# Patient Record
Sex: Male | Born: 1995 | Hispanic: No | Marital: Single | Smoking: Former smoker
Health system: Southern US, Community
[De-identification: ages and names within clinical notes are randomized; demographics above are authoritative.]

## PROBLEM LIST (undated history)

## (undated) DIAGNOSIS — R748 Abnormal levels of other serum enzymes: Secondary | ICD-10-CM

## (undated) HISTORY — PX: DENTAL SURGERY: SHX609

## (undated) HISTORY — PX: HERNIA REPAIR: SHX51

## (undated) HISTORY — DX: Abnormal levels of other serum enzymes: R74.8

---
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
## 2018-06-26 ENCOUNTER — Other Ambulatory Visit: Payer: Self-pay

## 2018-06-26 ENCOUNTER — Emergency Department (HOSPITAL_COMMUNITY)
Admission: EM | Admit: 2018-06-26 | Discharge: 2018-06-26 | Disposition: A | Discharge status: Home / Self Care | Payer: Managed Care, Other (non HMO) | Attending: Emergency Medicine | Admitting: Emergency Medicine

## 2018-06-26 ENCOUNTER — Encounter (HOSPITAL_COMMUNITY): Payer: Self-pay | Admitting: Emergency Medicine

## 2018-06-26 DIAGNOSIS — R69 Illness, unspecified: Secondary | ICD-10-CM

## 2018-06-26 DIAGNOSIS — F17210 Nicotine dependence, cigarettes, uncomplicated: Secondary | ICD-10-CM | POA: Diagnosis not present

## 2018-06-26 DIAGNOSIS — Z79899 Other long term (current) drug therapy: Secondary | ICD-10-CM | POA: Diagnosis not present

## 2018-06-26 DIAGNOSIS — J111 Influenza due to unidentified influenza virus with other respiratory manifestations: Secondary | ICD-10-CM

## 2018-06-26 DIAGNOSIS — R0981 Nasal congestion: Secondary | ICD-10-CM | POA: Diagnosis present

## 2018-06-26 MED ORDER — BENZONATATE 100 MG PO CAPS: 100.0000 mg | ORAL_CAPSULE | Freq: Three times a day (TID) | ORAL | 0 refills | Status: AC

## 2018-06-26 MED ORDER — FLUTICASONE PROPIONATE 50 MCG/ACT NA SUSP: 1.0000 | Freq: Every day | NASAL | 2 refills | Status: AC

## 2018-06-26 MED ORDER — KETOROLAC TROMETHAMINE 60 MG/2ML IM SOLN
30.0000 mg | Freq: Once | INTRAMUSCULAR | Status: AC
  Administered 2018-06-26: 30 mg via INTRAMUSCULAR
  Filled 2018-06-26: qty 2

## 2018-06-26 NOTE — ED Provider Notes (Signed)
[facility] EMERGENCY DEPARTMENT Provider Note   CSN: [id number] Arrival date & time: 06/26/18  2200     History   Chief Complaint Chief Complaint  Patient presents with  . Flu like symptoms    HPI Isaac Wiley is a 23 y.o. male who presents for evaluation of 4 days of nasal congestion, cough, generalized body aches, diarrhea, fever, chills, fatigue, decreased appetite.  He states that he measured a fever earlier tonight at 101.0.  He states he took Tylenol for fever relief.  He has not taken any other medications.  He states that he is still been able to eat and drink but does report some decreased appetite.  He reports he has not had any vomiting or abdominal pain.  He does report that he has had some episodes of loose stools.  He denies any blood in stool.  Denies any recent antibiotic use, travel outside the country.  Patient states that he has had a cough that is productive of phlegm.  No hemoptysis.  Patient states that he has no history of asthma.  He did not get a flu shot this year.  He reports that his girlfriend is sick with similar symptoms.  The history is provided by the patient.    Past Medical History:  Diagnosis Date  . Elevated liver enzymes     Patient Active Problem List   Diagnosis Date Noted  . Obesity 01/13/2012  . Elevated transaminase level     Past Surgical History:  Procedure Laterality Date  . DENTAL SURGERY    . HERNIA REPAIR          Home Medications    Prior to Admission medications   Medication Sig Start Date End Date Taking? Authorizing Provider  amphetamine-dextroamphetamine (ADDERALL XR) 20 MG 24 hr capsule  08/26/12   [provider]  benzonatate (TESSALON) 100 MG capsule Take 1 capsule (100 mg total) by mouth every 8 (eight) hours. 06/26/18   Maxwell Caul, PA-C  fluticasone (FLONASE) 50 MCG/ACT nasal spray Place 1 spray into both nostrils daily. 06/26/18   Maxwell Caul, PA-C  SEROQUEL XR 50 MG TB24  08/11/12    [provider]    Family History No family history on file.  Social History Social History   Tobacco Use  . Smoking status: Current Every Day Smoker    Packs/day: 0.50    Types: Cigarettes  . Smokeless tobacco: Never Used  Substance Use Topics  . Alcohol use: Never    Frequency: Never  . Drug use: Never     Allergies   Patient has no known allergies.   Review of Systems Review of Systems  Constitutional: Positive for appetite change, chills, fatigue and fever.  HENT: Positive for congestion. Negative for sore throat.   Respiratory: Positive for cough. Negative for shortness of breath.   Cardiovascular: Negative for chest pain.  Gastrointestinal: Positive for diarrhea. Negative for abdominal pain, nausea and vomiting.  All other systems reviewed and are negative.    Physical Exam Updated Vital Signs BP 126/68 (BP Location: Right Arm)   Pulse 80   Temp 98.9 F (37.2 C) (Oral)   Resp 15   Ht 5\' 6"  (1.676 m)   Wt 120.2 kg   SpO2 98%   BMI 42.77 kg/m   Physical Exam Vitals signs and nursing note reviewed.  Constitutional:      Appearance: Normal appearance. He is well-developed.  HENT:     Head: Normocephalic and atraumatic.  Nose: Congestion present.     Comments: Edematous and erythematous nasal turbinates bilaterally.    Mouth/Throat:     Pharynx: Posterior oropharyngeal erythema present.     Comments: Slight posterior oropharynx erythema. No edema. No exudates. Airway is patent, phonation is intact. Uvula is midline.  No trismus. Eyes:     General: Lids are normal.     Conjunctiva/sclera: Conjunctivae normal.     Pupils: Pupils are equal, round, and reactive to light.  Neck:     Musculoskeletal: Full passive range of motion without pain.  Cardiovascular:     Rate and Rhythm: Normal rate and regular rhythm.     Pulses: Normal pulses.     Heart sounds: Normal heart sounds. No murmur. No friction rub. No gallop.   Pulmonary:     Effort:  Pulmonary effort is normal.     Breath sounds: Normal breath sounds.     Comments: Lungs clear to auscultation bilaterally.  Symmetric chest rise.  No wheezing, rales, rhonchi. Abdominal:     Palpations: Abdomen is soft. Abdomen is not rigid.     Tenderness: There is no abdominal tenderness. There is no guarding.     Comments: Abdomen is soft, non-distended, non-tender. No rigidity, No guarding. No peritoneal signs.  Musculoskeletal: Normal range of motion.  Skin:    General: Skin is warm and dry.     Capillary Refill: Capillary refill takes less than 2 seconds.  Neurological:     Mental Status: He is alert and oriented to person, place, and time.  Psychiatric:        Speech: Speech normal.      ED Treatments / Results  Labs (all labs ordered are listed, but only abnormal results are displayed) Labs Reviewed - No data to display  EKG None  Radiology No results found.  Procedures Procedures (including critical care time)  Medications Ordered in ED Medications  ketorolac (TORADOL) injection 30 mg (30 mg Intramuscular Given 06/26/18 2320)     Initial Impression / Assessment and Plan / ED Course  I have reviewed the triage vital signs and the nursing notes.  Pertinent labs & imaging results that were available during my care of the patient were reviewed by me and considered in my medical decision making (see chart for details).     23 year old male who presents for evaluation of 4 days of generalized body aches, fever/chills, fatigue, diarrhea, congestion.  Reports girlfriend at home with similar symptoms.  He did not get a flu shot this year.  On initial ED arrival, patient is febrile, slightly tachycardic.  Vital signs otherwise stable.  On exam, no abdominal tenderness.  Lungs clear to auscultation bilaterally.  Consider influenza versus viral illness.  History/physical exam not concerning for appendicitis, diverticulitis.  History/physical exam not concerning for  pneumonia.  I discussed with patient that given that he is greater than 48 hours since on his symptoms, he is not a candidate for Tamiflu.  Plan for p.o. fluids, analgesics here in ED.  Repeat vitals stable.  Temperature has improved since being here in the ED.  Patient stable for discharge at this time. At this time, patient exhibits no emergent life-threatening condition that require further evaluation in ED or admission Patient had ample opportunity for questions and discussion. All patient's questions were answered with full understanding. Strict return precautions discussed. Patient expresses understanding and agreement to plan.   Portions of this note were generated with Scientist, clinical (histocompatibility and immunogenetics). Dictation errors may occur despite best  attempts at proofreading.  Final Clinical Impressions(s) / ED Diagnoses   Final diagnoses:  Influenza-like illness    ED Discharge Orders         Ordered    fluticasone (FLONASE) 50 MCG/ACT nasal spray  Daily     06/26/18 2326    benzonatate (TESSALON) 100 MG capsule  Every 8 hours     06/26/18 2326           Maxwell CaulLayden, Khali Albanese A, PA-C 06/26/18 2335    Gilda CreasePollina, Christopher J, MD 06/26/18 [phone]

## 2018-06-26 NOTE — ED Triage Notes (Signed)
Onset 4 days fever, diarrhea, chest and nasal congestion, body aches, cough with clear mucus.

## 2018-06-26 NOTE — Discharge Instructions (Signed)
You can take Tylenol or Ibuprofen as directed for pain. You can alternate Tylenol and Ibuprofen every 4 hours. If you take Tylenol at 1pm, then you can take Ibuprofen at 5pm. Then you can take Tylenol again at 9pm.   Make sure you are drinking plenty of fluids and staying hydrated.   Use Flonase for congestion and Tessalon Perles for cough.  Return to the emergency department for any fever despite medications, vomiting, difficulty breathing, abdominal pain or any other worsening or concerning symptoms.

## 2020-01-09 ENCOUNTER — Other Ambulatory Visit: Payer: Self-pay

## 2020-01-09 ENCOUNTER — Encounter (HOSPITAL_COMMUNITY): Payer: Self-pay | Admitting: *Deleted

## 2020-01-09 ENCOUNTER — Emergency Department (HOSPITAL_COMMUNITY)
Admission: EM | Admit: 2020-01-09 | Discharge: 2020-01-09 | Disposition: A | Discharge status: Home / Self Care | Payer: Managed Care, Other (non HMO) | Attending: Emergency Medicine | Admitting: Emergency Medicine

## 2020-01-09 ENCOUNTER — Emergency Department (HOSPITAL_COMMUNITY): Payer: Managed Care, Other (non HMO)

## 2020-01-09 DIAGNOSIS — R05 Cough: Secondary | ICD-10-CM | POA: Diagnosis present

## 2020-01-09 DIAGNOSIS — Z87891 Personal history of nicotine dependence: Secondary | ICD-10-CM | POA: Insufficient documentation

## 2020-01-09 DIAGNOSIS — Z79899 Other long term (current) drug therapy: Secondary | ICD-10-CM | POA: Diagnosis not present

## 2020-01-09 DIAGNOSIS — K2100 Gastro-esophageal reflux disease with esophagitis, without bleeding: Secondary | ICD-10-CM | POA: Insufficient documentation

## 2020-01-09 DIAGNOSIS — Z20822 Contact with and (suspected) exposure to covid-19: Secondary | ICD-10-CM | POA: Insufficient documentation

## 2020-01-09 LAB — SARS CORONAVIRUS 2 BY RT PCR (HOSPITAL ORDER, PERFORMED IN ~~LOC~~ HOSPITAL LAB): SARS Coronavirus 2: NEGATIVE

## 2020-01-09 MED ORDER — PANTOPRAZOLE SODIUM 40 MG PO TBEC
40.0000 mg | DELAYED_RELEASE_TABLET | Freq: Once | ORAL | Status: AC
  Administered 2020-01-09: 40 mg via ORAL
  Filled 2020-01-09: qty 1

## 2020-01-09 MED ORDER — PANTOPRAZOLE SODIUM 20 MG PO TBEC: 20.0000 mg | DELAYED_RELEASE_TABLET | Freq: Every day | ORAL | 0 refills | Status: AC

## 2020-01-09 MED ORDER — ALBUTEROL SULFATE HFA 108 (90 BASE) MCG/ACT IN AERS
2.0000 | INHALATION_SPRAY | Freq: Once | RESPIRATORY_TRACT | Status: AC
  Administered 2020-01-09: 2 via RESPIRATORY_TRACT
  Filled 2020-01-09: qty 6.7

## 2020-01-09 NOTE — ED Triage Notes (Signed)
Pt reports for the last 3-4 weeks when he lays down to go to sleep he starts coughing and coughs until he can't breathe. He states if he uses his girlfriend's inhaler he gets better for a while but the symptoms seem to be worsening.pt denies any shortness of breath at any other time.  Also reports after he eats he sometimes feels like the food gets stuck in his throat. NAD.

## 2020-01-09 NOTE — Discharge Instructions (Signed)
Follow-up with your family doctor for Dr. Karilyn Cota in 1 to 2 weeks

## 2020-01-09 NOTE — ED Provider Notes (Signed)
[facility] EMERGENCY DEPARTMENT Provider Note   CSN: [id number] Arrival date & time: 01/09/20  0125     History Chief Complaint  Patient presents with  . Cough    Isaac Wiley is a 24 y.o. male.  Patient complains of coughing after laying down in bed with some shortness of breath  The history is provided by the patient and medical records. No language interpreter was used.  Cough Cough characteristics:  Dry and non-productive Sputum characteristics:  Nondescript Severity:  Mild Onset quality:  Gradual Timing:  Constant Progression:  Waxing and waning Chronicity:  New Smoker: no   Relieved by:  Nothing Worsened by:  Nothing Ineffective treatments:  None tried Associated symptoms: no chest pain, no eye discharge, no headaches and no rash        Past Medical History:  Diagnosis Date  . Elevated liver enzymes     Patient Active Problem List   Diagnosis Date Noted  . Obesity 01/13/2012  . Elevated transaminase level     Past Surgical History:  Procedure Laterality Date  . DENTAL SURGERY    . HERNIA REPAIR         No family history on file.  Social History   Tobacco Use  . Smoking status: Former Smoker    Packs/day: 0.50    Types: Cigarettes  . Smokeless tobacco: Never Used  Vaping Use  . Vaping Use: Never used  Substance Use Topics  . Alcohol use: Never  . Drug use: Never    Home Medications Prior to Admission medications   Medication Sig Start Date End Date Taking? Authorizing Provider  amphetamine-dextroamphetamine (ADDERALL XR) 20 MG 24 hr capsule  08/26/12   [provider]  benzonatate (TESSALON) 100 MG capsule Take 1 capsule (100 mg total) by mouth every 8 (eight) hours. Patient not taking: Reported on 01/09/2020 06/26/18   Graciella Freer A, PA-C  fluticasone (FLONASE) 50 MCG/ACT nasal spray Place 1 spray into both nostrils daily. Patient not taking: Reported on 01/09/2020 06/26/18   Graciella Freer A, PA-C  pantoprazole  (PROTONIX) 20 MG tablet Take 1 tablet (20 mg total) by mouth daily. 01/09/20   Bethann Berkshire, MD    Allergies    Patient has no known allergies.  Review of Systems   Review of Systems  Constitutional: Negative for appetite change and fatigue.  HENT: Negative for congestion, ear discharge and sinus pressure.   Eyes: Negative for discharge.  Respiratory: Positive for cough.   Cardiovascular: Negative for chest pain.  Gastrointestinal: Negative for abdominal pain and diarrhea.  Genitourinary: Negative for frequency and hematuria.  Musculoskeletal: Negative for back pain.  Skin: Negative for rash.  Neurological: Negative for seizures and headaches.  Psychiatric/Behavioral: Negative for hallucinations.    Physical Exam Updated Vital Signs BP 114/68   Pulse 75   Temp 98.4 F (36.9 C) (Oral)   Resp 16   Ht 5\' 7"  (1.702 m)   Wt 109.7 kg   SpO2 99%   BMI 37.87 kg/m   Physical Exam Vitals and nursing note reviewed.  Constitutional:      Appearance: He is well-developed.  HENT:     Head: Normocephalic.     Nose: Nose normal.     Mouth/Throat:     Mouth: Mucous membranes are moist.  Eyes:     General: No scleral icterus.    Conjunctiva/sclera: Conjunctivae normal.  Neck:     Thyroid: No thyromegaly.  Cardiovascular:     Rate and Rhythm:  Normal rate and regular rhythm.     Heart sounds: No murmur heard.  No friction rub. No gallop.   Pulmonary:     Breath sounds: No stridor. No wheezing or rales.  Chest:     Chest wall: No tenderness.  Abdominal:     General: There is no distension.     Tenderness: There is no abdominal tenderness. There is no rebound.  Musculoskeletal:        General: Normal range of motion.     Cervical back: Neck supple.  Lymphadenopathy:     Cervical: No cervical adenopathy.  Skin:    Findings: No erythema or rash.  Neurological:     Mental Status: He is alert and oriented to person, place, and time.     Motor: No abnormal muscle tone.      Coordination: Coordination normal.  Psychiatric:        Behavior: Behavior normal.     ED Results / Procedures / Treatments   Labs (all labs ordered are listed, but only abnormal results are displayed) Labs Reviewed  SARS CORONAVIRUS 2 BY RT PCR (HOSPITAL ORDER, PERFORMED IN [facility])    EKG None  Radiology DG Chest Port 1 View  Result Date: 01/09/2020 CLINICAL DATA:  Cough x3 weeks EXAM: PORTABLE CHEST 1 VIEW COMPARISON:  None. FINDINGS: Mild right basilar opacity, atelectasis versus mild pneumonia. Left lung is clear. No pleural effusion or pneumothorax. The heart is normal in size. IMPRESSION: Mild right basilar opacity, atelectasis versus mild pneumonia. Electronically Signed   By: Charline Bills M.D.   On: 01/09/2020 06:52    Procedures Procedures (including critical care time)  Medications Ordered in ED Medications  albuterol (VENTOLIN HFA) 108 (90 Base) MCG/ACT inhaler 2 puff (2 puffs Inhalation Given 01/09/20 0736)  pantoprazole (PROTONIX) EC tablet 40 mg (40 mg Oral Given 01/09/20 0736)    ED Course  I have reviewed the triage vital signs and the nursing notes.  Pertinent labs & imaging results that were available during my care of the patient were reviewed by me and considered in my medical decision making (see chart for details).    MDM Rules/Calculators/A&P                         Patient with cough when he lays down.  Negative Covid.  Chest x-ray shows most likely atelectasis.  I suspect he is having GERD symptoms and will place him on protonic and have him follow-up with GI     This patient presents to the ED for concern of cough, this involves an extensive number of treatment options, and is a complaint that carries with it a high risk of complications and morbidity.  The differential diagnosis includes Covid bronchitis GERD   Lab Tests:     Medicines ordered:   I ordered medication protonic  Imaging Studies ordered:   I ordered  imaging studies which included chest x-ray  I independently visualized and interpreted imaging which showed atelectasis  Additional history obtained:   Additional history obtained from record  Previous records obtained and reviewed..  Consultations Obtained:    scussed lab an Reevaluation:  After the interventions stated above, I reevaluated the patient and found unchanged  Critical Interventions:  .   Final Clinical Impression(s) / ED Diagnoses Final diagnoses:  Gastroesophageal reflux disease with esophagitis without hemorrhage    Rx / DC Orders ED Discharge Orders  Ordered    pantoprazole (PROTONIX) 20 MG tablet  Daily     Discontinue  Reprint     01/09/20 0941           Bethann Berkshire, MD 01/09/20 [phone]

## 2020-01-26 ENCOUNTER — Other Ambulatory Visit: Payer: Managed Care, Other (non HMO)

## 2020-01-26 ENCOUNTER — Other Ambulatory Visit: Payer: Self-pay

## 2020-01-26 DIAGNOSIS — Z20822 Contact with and (suspected) exposure to covid-19: Secondary | ICD-10-CM

## 2020-01-27 LAB — NOVEL CORONAVIRUS, NAA: SARS-CoV-2, NAA: NOT DETECTED

## 2020-01-27 LAB — SARS-COV-2, NAA 2 DAY TAT

## 2020-08-13 IMAGING — DX DG CHEST 1V PORT
1 series · 1 of 1 positions shown · non-contrast
Comparison: None.

CLINICAL DATA: Cough x3 weeks

EXAM:
PORTABLE CHEST 1 VIEW

[chest ap]
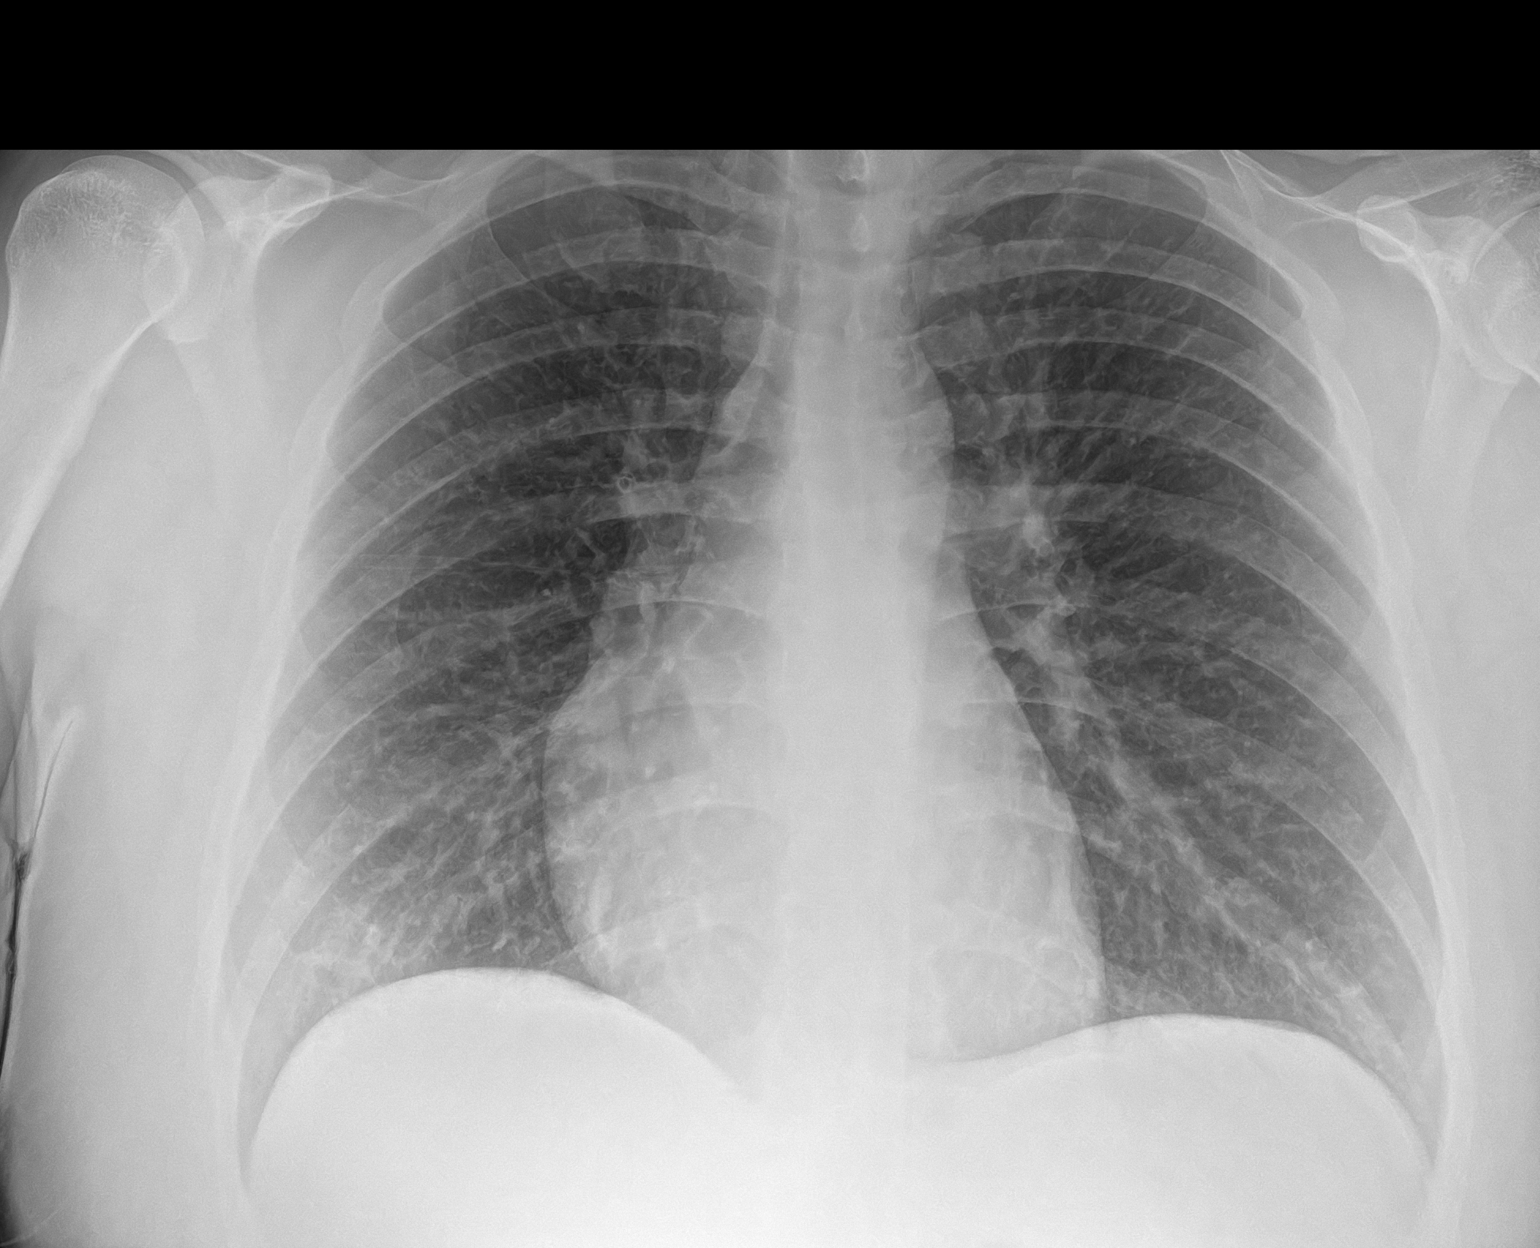

[1 of 1 positions shown; findings below may reference images not displayed]

FINDINGS: Mild right basilar opacity, atelectasis versus mild pneumonia. Left
lung is clear. No pleural effusion or pneumothorax.

The heart is normal in size.
IMPRESSION: Mild right basilar opacity, atelectasis versus mild pneumonia.
